# Patient Record
Sex: Female | Born: 1980 | Race: White | Hispanic: No | Marital: Married | Smoking: Never smoker
Health system: Southern US, Community
[De-identification: ages and names within clinical notes are randomized; demographics above are authoritative.]

---

## 2007-12-27 ENCOUNTER — Inpatient Hospital Stay: Payer: Self-pay | Admitting: Obstetrics and Gynecology

## 2008-11-28 ENCOUNTER — Ambulatory Visit: Payer: Self-pay | Admitting: Internal Medicine

## 2013-08-03 ENCOUNTER — Inpatient Hospital Stay: Payer: Self-pay | Admitting: Obstetrics and Gynecology

## 2013-08-03 LAB — CBC WITH DIFFERENTIAL/PLATELET
Basophil #: 0.1 10*3/uL (ref 0.0–0.1)
Basophil %: 0.7 %
EOS PCT: 1 %
Eosinophil #: 0.1 10*3/uL (ref 0.0–0.7)
HCT: 32.6 % — ABNORMAL LOW (ref 35.0–47.0)
HGB: 11.2 g/dL — AB (ref 12.0–16.0)
LYMPHS PCT: 13.5 %
Lymphocyte #: 1.2 10*3/uL (ref 1.0–3.6)
MCH: 30.6 pg (ref 26.0–34.0)
MCHC: 34.3 g/dL (ref 32.0–36.0)
MCV: 89 fL (ref 80–100)
Monocyte #: 0.5 x10 3/mm (ref 0.2–0.9)
Monocyte %: 5.8 %
NEUTROS ABS: 7.2 10*3/uL — AB (ref 1.4–6.5)
Neutrophil %: 79 %
Platelet: 162 10*3/uL (ref 150–440)
RBC: 3.66 10*6/uL — AB (ref 3.80–5.20)
RDW: 13.4 % (ref 11.5–14.5)
WBC: 9.1 10*3/uL (ref 3.6–11.0)

## 2013-08-05 LAB — HEMATOCRIT: HCT: 29.1 % — ABNORMAL LOW (ref 35.0–47.0)

## 2013-12-01 ENCOUNTER — Ambulatory Visit: Payer: Self-pay | Admitting: Medical

## 2013-12-01 LAB — URINALYSIS, COMPLETE
Bilirubin,UR: NEGATIVE
GLUCOSE, UR: NEGATIVE mg/dL (ref 0–75)
Ketone: NEGATIVE
NITRITE: NEGATIVE
PH: 7 (ref 4.5–8.0)
PROTEIN: NEGATIVE
Specific Gravity: 1.02 (ref 1.003–1.030)

## 2013-12-04 LAB — URINE CULTURE

## 2014-09-11 NOTE — H&P (Signed)
L&D Evaluation:  History:  HPI 34 y/o G2P1001 @ 40/2wks for IOL @ term. Care @ [facility] pregancny was twin gestation with fetal demise @ 8wks. Obesity, anemia, GBS+   Presents with above   Patient's Medical History No Chronic Illness   Patient's Surgical History none   Medications Pre Natal Vitamins   Allergies NKDA   Social History none   Family History Non-Contributory   ROS:  ROS All systems were reviewed.  HEENT, CNS, GI, GU, Respiratory, CV, Renal and Musculoskeletal systems were found to be normal.   Exam:  Vital Signs stable   Urine Protein not completed   General no apparent distress   Mental Status clear   Chest clear   Heart normal sinus rhythm   Abdomen gravid, non-tender   Estimated Fetal Weight Average for gestational age   Fetal Position vtx   Fundal Height term   Back no CVAT   Edema 1+   Reflexes 2+   Clonus negative   Pelvic no external lesions, 2-3cm 50% vtx @ -1 small show BOWI   Mebranes Intact   FHT normal rate with no decels, baseline 130's avg variability with accels 140's 150's   FHT Description 136   Ucx regular, Q 3mins 45-60 sec moderate   Skin dry   Lymph no lymphadenopathy   Impression:  Impression IOL @ term   Plan:  Plan monitor contractions and for cervical change, antibiotics for GBBS prophylaxis   Comments Knows what to expect 2nd baby. IV ABX and Pitocin begun. May consider epidural with labor progress. Family present at bedside.   Electronic Signatures: Albertina ParrLugiano, Goble Fudala B (CNM)  (Signed 02-Apr-15 12:11)  Authored: L&D Evaluation   Last Updated: 02-Apr-15 12:11 by Albertina ParrLugiano, Ulric Salzman B (CNM)

## 2015-04-12 ENCOUNTER — Encounter: Payer: Self-pay | Admitting: Emergency Medicine

## 2015-04-12 ENCOUNTER — Ambulatory Visit
Admission: EM | Admit: 2015-04-12 | Discharge: 2015-04-12 | Disposition: A | Discharge status: Home / Self Care | Payer: BLUE CROSS/BLUE SHIELD | Attending: Family Medicine | Admitting: Family Medicine

## 2015-04-12 DIAGNOSIS — H109 Unspecified conjunctivitis: Secondary | ICD-10-CM | POA: Diagnosis not present

## 2015-04-12 MED ORDER — GENTAMICIN SULFATE 0.3 % OP SOLN: 2.0000 [drp] | Freq: Four times a day (QID) | OPHTHALMIC | Status: DC

## 2015-04-12 NOTE — ED Provider Notes (Signed)
CSN: [id number]     Arrival date & time 04/12/15  4098 History   None   Nurses notes were reviewed. Chief Complaint  Patient presents with  . Eye Drainage   patient reports drainage started in the left eye yesterday. She reports being fine yesterday morning but by yesterday afternoon she started noticing irritation in the left eye. She denies any foreign objects and reports this morning he progressively became worse increased redness increase Manna anabiotic was actually closed with staff this morning. To best knowledge she is not running with pinkeye she's had a mild URI but nothing significant lately. She denies any ear pain and sinus pain or sinus pressure. (Consider location/radiation/quality/duration/timing/severity/associated sxs/prior Treatment) Patient is a 34 y.o. female presenting with eye problem. The history is provided by the patient. No language interpreter was used.  Eye Problem Location:  L eye Quality:  Burning, stinging and tearing Severity:  Moderate Onset quality:  Sudden Timing:  Constant Progression:  Worsening Context: not contact lens problem, not direct trauma and not foreign body   Relieved by:  Nothing Ineffective treatments:  None tried Associated symptoms: discharge, itching and redness   Risk factors: not exposed to pinkeye, no previous injury to eye, no recent herpes zoster and no recent URI     History reviewed. No pertinent past medical history. History reviewed. No pertinent past surgical history. History reviewed. No pertinent family history. Social History  Substance Use Topics  . Smoking status: Never Smoker   . Smokeless tobacco: None  . Alcohol Use: Yes   OB History    No data available     Review of Systems  Eyes: Positive for pain, discharge, redness and itching.  All other systems reviewed and are negative.   Allergies  Review of patient's allergies indicates no known allergies.  Home Medications   Prior to Admission medications    Medication Sig Start Date End Date Taking? Authorizing Provider  gentamicin (GARAMYCIN) 0.3 % ophthalmic solution Place 2 drops into the left eye 4 (four) times daily. 04/12/15   Hassan Rowan, MD   Meds Ordered and Administered this Visit  Medications - No data to display  BP 116/46 mmHg  Pulse 68  Temp(Src) 97.2 F (36.2 C) (Tympanic)  Resp 16  Ht  (1.727 m)  Wt 235 lb (106.595 kg)  BMI 35.74 kg/m2  SpO2 100%  LMP 03/29/2015 (Approximate) No data found.   Physical Exam  Constitutional: She is oriented to person, place, and time. She appears well-developed and well-nourished.  HENT:  Head: Normocephalic and atraumatic.  Right Ear: External ear normal.  Left Ear: External ear normal.  Mouth/Throat: Oropharynx is clear and moist.  Eyes: EOM are normal. Pupils are equal, round, and reactive to light. Left eye exhibits discharge. Left conjunctiva is injected. No scleral icterus.    Neck: Normal range of motion. Neck supple.  Musculoskeletal: Normal range of motion.  Lymphadenopathy:    She has no cervical adenopathy.  Neurological: She is alert and oriented to person, place, and time.  Skin: Skin is warm and dry.  Psychiatric: She has a normal mood and affect.    ED Course  Procedures (including critical care time)  Labs Review Labs Reviewed - No data to display  Imaging Review No results found.   Visual Acuity Review  Right Eye Distance: 20/20 uncorrected Left Eye Distance: 20/20 uncorrected Bilateral Distance:    Right Eye Near:   Left Eye Near:    Bilateral Near:  MDM   1. Conjunctivitis of left eye    We'll prescribe Garamycin eyedrops 2 drops left eye for the next 5-7 days got better please return or see ophthalmologist. Work note for today given as well.   Hassan RowanEugene Demarius Archila, MD 04/12/15 [phone]

## 2015-04-12 NOTE — ED Notes (Signed)
Patient c/o redness and drainage from her left eye since yesterday.

## 2015-04-12 NOTE — Discharge Instructions (Signed)
Bacterial Conjunctivitis Bacterial conjunctivitis (commonly called pink eye) is redness, soreness, or puffiness (inflammation) of the white part of your eye. It is caused by a germ called bacteria. These germs can easily spread from person to person (contagious). Your eye often will become red or pink. Your eye may also become irritated, watery, or have a thick discharge.  HOME CARE   Apply a cool, clean washcloth over closed eyelids. Do this for 10-20 minutes, 3-4 times a day while you have pain.  Gently wipe away any fluid coming from the eye with a warm, wet washcloth or cotton ball.  Wash your hands often with soap and water. Use paper towels to dry your hands.  Do not share towels or washcloths.  Change or wash your pillowcase every day.  Do not use eye makeup until the infection is gone.  Do not use machines or drive if your vision is blurry.  Stop using contact lenses. Do not use them again until your doctor says it is okay.  Do not touch the tip of the eye drop bottle or medicine tube with your fingers when you put medicine on the eye. GET HELP RIGHT AWAY IF:   Your eye is not better after 3 days of starting your medicine.  You have a yellowish fluid coming out of the eye.  You have more pain in the eye.  Your eye redness is spreading.  Your vision becomes blurry.  You have a fever or lasting symptoms for more than 2-3 days.  You have a fever and your symptoms suddenly get worse.  You have pain in the face.  Your face gets red or puffy (swollen). MAKE SURE YOU:   Understand these instructions.  Will watch this condition.  Will get help right away if you are not doing well or get worse.   This information is not intended to replace advice given to you by your health care provider. Make sure you discuss any questions you have with your health care provider.   Document Released: 01/28/2008 Document Revised: 04/06/2012 Document Reviewed: 12/25/2011 Elsevier  Interactive Patient Education 2016 Elsevier Inc.  

## 2015-10-30 ENCOUNTER — Other Ambulatory Visit: Payer: Self-pay | Admitting: Obstetrics and Gynecology

## 2017-07-06 ENCOUNTER — Encounter: Payer: Self-pay | Admitting: Emergency Medicine

## 2017-07-06 ENCOUNTER — Other Ambulatory Visit: Payer: Self-pay

## 2017-07-06 ENCOUNTER — Ambulatory Visit
Admission: EM | Admit: 2017-07-06 | Discharge: 2017-07-06 | Disposition: A | Discharge status: Home / Self Care | Payer: BLUE CROSS/BLUE SHIELD | Attending: Family Medicine | Admitting: Family Medicine

## 2017-07-06 DIAGNOSIS — J0100 Acute maxillary sinusitis, unspecified: Secondary | ICD-10-CM

## 2017-07-06 DIAGNOSIS — B9789 Other viral agents as the cause of diseases classified elsewhere: Secondary | ICD-10-CM | POA: Diagnosis not present

## 2017-07-06 DIAGNOSIS — J069 Acute upper respiratory infection, unspecified: Secondary | ICD-10-CM

## 2017-07-06 MED ORDER — BENZONATATE 100 MG PO CAPS: 100.0000 mg | ORAL_CAPSULE | Freq: Three times a day (TID) | ORAL | 0 refills | Status: DC | PRN

## 2017-07-06 MED ORDER — AMOXICILLIN-POT CLAVULANATE 875-125 MG PO TABS: 1.0000 | ORAL_TABLET | Freq: Two times a day (BID) | ORAL | 0 refills | Status: DC

## 2017-07-06 MED ORDER — HYDROCOD POLST-CPM POLST ER 10-8 MG/5ML PO SUER: 5.0000 mL | Freq: Every evening | ORAL | 0 refills | Status: DC | PRN

## 2017-07-06 NOTE — Discharge Instructions (Signed)
Take medication as prescribed. Rest. Drink plenty of fluids.  ° °Follow up with your primary care physician this week as needed. Return to Urgent care for new or worsening concerns.  ° °

## 2017-07-06 NOTE — ED Triage Notes (Signed)
Patient c/o sinus pain and pressure and nasal congestion since Thursday.  Patient denies fevers.

## 2017-07-06 NOTE — ED Provider Notes (Signed)
[facility] ____________________________________________  Time seen: Approximately 2:37 PM  I have reviewed the triage vital signs and the nursing notes.   HISTORY  Chief Complaint Sinus Problem and Nasal Congestion   HPI Gabrielle Jarvis is a 37 y.o. female presenting for evaluation of 5 days of runny nose, nasal congestion, sinus pressure and sinus drainage.  Patient reports cough with associated postnasal drainage, cough is worse at night.  Denies known fevers, chills or body aches.  Reports multiple sick contacts at work.  Has continued to eat and drink well.  Has tried over-the-counter Mucinex D without much change.  States nasal drainage has been thick and green.  Denies other aggravating or alleviating factors.  Reports moderate forehead and cheekbone pressure sensation.  Denies other complaints. Denies chest pain, shortness of breath, abdominal pain, or rash. Denies recent sickness. Denies recent antibiotic use.   Patient's last menstrual period was 06/15/2017 (approximate).Denies pregnancy.   History reviewed. No pertinent past medical history.  There are no active problems to display for this patient.   History reviewed. No pertinent surgical history.   No current facility-administered medications for this encounter.   Current Outpatient Medications:  .  amoxicillin-clavulanate (AUGMENTIN) 875-125 MG tablet, Take 1 tablet by mouth every 12 (twelve) hours., Disp: 20 tablet, Rfl: 0 .  benzonatate (TESSALON PERLES) 100 MG capsule, Take 1 capsule (100 mg total) by mouth 3 (three) times daily as needed for cough., Disp: 15 capsule, Rfl: 0 .  chlorpheniramine-HYDROcodone (TUSSIONEX PENNKINETIC ER) 10-8 MG/5ML SUER, Take 5 mLs by mouth at bedtime as needed for cough. do not drive or operate machinery while taking as can cause drowsiness., Disp: 60 mL, Rfl: 0  Allergies Patient has no known allergies.  Family History  Problem Relation Age of Onset  . Parkinson's  disease Father     Social History Social History   Tobacco Use  . Smoking status: Never Smoker  . Smokeless tobacco: Never Used  Substance Use Topics  . Alcohol use: Yes  . Drug use: Not on file    Review of Systems Constitutional: No fever/chills ENT: No sore throat. As above.  Cardiovascular: Denies chest pain. Respiratory: Denies shortness of breath. Gastrointestinal: No abdominal pain.  Genitourinary: Negative for dysuria. Musculoskeletal: Negative for back pain. Skin: Negative for rash.   ____________________________________________   PHYSICAL EXAM:  VITAL SIGNS: ED Triage Vitals  Enc Vitals Group     BP 07/06/17 1330 (!) 108/53     Pulse Rate 07/06/17 1330 75     Resp 07/06/17 1330 14     Temp 07/06/17 1330 98 F (36.7 C)     Temp Source 07/06/17 1330 Oral     SpO2 07/06/17 1330 100 %     Weight 07/06/17 1327 194 lb (88 kg)     Height 07/06/17 1327 5\' 8"  (1.727 m)     Head Circumference --      Peak Flow --      Pain Score 07/06/17 1327 0     Pain Loc --      Pain Edu? --      Excl. in GC? --     Constitutional: Alert and oriented. Well appearing and in no acute distress. Eyes: Conjunctivae are normal.  Head: Atraumatic.Mild to moderate tenderness to palpation bilateral frontal and maxillary sinuses. No swelling. No erythema.   Ears: no erythema, normal TMs bilaterally.   Nose: nasal congestion with bilateral nasal turbinate erythema and edema.   Mouth/Throat: Mucous membranes are  moist.  Oropharynx non-erythematous.No tonsillar swelling or exudate.  Neck: No stridor.  No cervical spine tenderness to palpation. Hematological/Lymphatic/Immunilogical: No cervical lymphadenopathy. Cardiovascular: Normal rate, regular rhythm. Grossly normal heart sounds.  Good peripheral circulation. Respiratory: Normal respiratory effort.  No retractions. Lungs CTAB. No wheezes, rales or rhonchi. Good air movement.  Musculoskeletal: No cervical, thoracic or lumbar  tenderness to palpation.  Neurologic:  Normal speech and language. No gait instability. Skin:  Skin is warm, dry and intact. No rash noted. Psychiatric: Mood and affect are normal. Speech and behavior are normal.  ___________________________________________   LABS (all labs ordered are listed, but only abnormal results are displayed)  Labs Reviewed - No data to display   PROCEDURES Procedures   INITIAL IMPRESSION / ASSESSMENT AND PLAN / ED COURSE  Pertinent labs & imaging results that were available during my care of the patient were reviewed by me and considered in my medical decision making (see chart for details).  Well-appearing patient.  No acute distress.  Suspect viral upper respiratory infection.  Suspect patient with current viral sinusitis, discussed with patient supportive care, Tessalon Perles, as needed Tussionex and over-the-counter Sudafed.  Hardcopy Rx of Augmentin given to begin after 3 days if symptoms not improving.  Discussed indication, risks and benefits of medications with patient.  Discussed follow up with Primary care physician this week. Discussed follow up and return parameters including no resolution or any worsening concerns. Patient verbalized understanding and agreed to plan.   ____________________________________________   FINAL CLINICAL IMPRESSION(S) / ED DIAGNOSES  Final diagnoses:  Viral URI with cough  Acute maxillary sinusitis, recurrence not specified     ED Discharge Orders        Ordered    amoxicillin-clavulanate (AUGMENTIN) 875-125 MG tablet  Every 12 hours     07/06/17 1438    benzonatate (TESSALON PERLES) 100 MG capsule  3 times daily PRN     07/06/17 1438    chlorpheniramine-HYDROcodone (TUSSIONEX PENNKINETIC ER) 10-8 MG/5ML SUER  At bedtime PRN     07/06/17 1438       Note: This dictation was prepared with Dragon dictation along with smaller phrase technology. Any transcriptional errors that result from this process are  unintentional.         Renford DillsMiller, Jaynia Fendley, NP 07/06/17 1544

## 2017-07-14 ENCOUNTER — Ambulatory Visit
Admission: EM | Admit: 2017-07-14 | Discharge: 2017-07-14 | Disposition: A | Discharge status: Home / Self Care | Payer: BLUE CROSS/BLUE SHIELD | Attending: Family Medicine | Admitting: Family Medicine

## 2017-07-14 ENCOUNTER — Encounter: Payer: Self-pay | Admitting: Emergency Medicine

## 2017-07-14 ENCOUNTER — Other Ambulatory Visit: Payer: Self-pay

## 2017-07-14 DIAGNOSIS — R05 Cough: Secondary | ICD-10-CM

## 2017-07-14 DIAGNOSIS — R0981 Nasal congestion: Secondary | ICD-10-CM | POA: Diagnosis not present

## 2017-07-14 DIAGNOSIS — M791 Myalgia, unspecified site: Secondary | ICD-10-CM | POA: Diagnosis not present

## 2017-07-14 DIAGNOSIS — J111 Influenza due to unidentified influenza virus with other respiratory manifestations: Secondary | ICD-10-CM

## 2017-07-14 DIAGNOSIS — R69 Illness, unspecified: Secondary | ICD-10-CM

## 2017-07-14 LAB — RAPID STREP SCREEN (MED CTR MEBANE ONLY): Streptococcus, Group A Screen (Direct): NEGATIVE

## 2017-07-14 MED ORDER — OSELTAMIVIR PHOSPHATE 75 MG PO CAPS: 75.0000 mg | ORAL_CAPSULE | Freq: Two times a day (BID) | ORAL | 0 refills | Status: DC

## 2017-07-14 NOTE — ED Triage Notes (Signed)
Patient states she developed sneezing, body aches, fever (tmax 100.4) and scratchy throat this afternoon.  States she has 1 child with the flu and 1 child with strep throat.

## 2017-07-14 NOTE — Discharge Instructions (Signed)
Take medication as prescribed. Rest. Drink plenty of fluids.  ° °Follow up with your primary care physician this week as needed. Return to Urgent care for new or worsening concerns.  ° °

## 2017-07-14 NOTE — ED Provider Notes (Signed)
[facility] ____________________________________________  Time seen: Approximately 7:08 PM  I have reviewed the triage vital signs and the nursing notes.   HISTORY  Chief Complaint Influenza   HPI Gabrielle Jarvis is a 37 y.o. female presenting for evaluation of runny nose, nasal congestion, cough, chills and body aches that started sudden onset at lunchtime today.  Patient reports this morning she felt fine.  Fever max 100.4 this afternoon.  Reports was recently seen in the urgent care for sinusitis complaints in which she did not have to take the antibiotic.  States sinusitis complaints resolved with over-the-counter Sudafed and prescription cough medications and supportive care.  States that she was doing better.  Reports 2 of her daughters have been sick this week, one tested positive for flu and one tested positive for strep this past Friday.  States that she did take Tylenol this afternoon.  Denies other aggravating or alleviating factors.  Slight scratchy throat sensation.  Continues to drink fluids well. Denies chest pain, shortness of breath, abdominal pain, or rash. Denies recent sickness. Denies recent antibiotic use.    History reviewed. No pertinent past medical history.  There are no active problems to display for this patient.   History reviewed. No pertinent surgical history.   No current facility-administered medications for this encounter.   Current Outpatient Medications:  .  chlorpheniramine-HYDROcodone (TUSSIONEX PENNKINETIC ER) 10-8 MG/5ML SUER, Take 5 mLs by mouth at bedtime as needed for cough. do not drive or operate machinery while taking as can cause drowsiness., Disp: 60 mL, Rfl: 0 .  oseltamivir (TAMIFLU) 75 MG capsule, Take 1 capsule (75 mg total) by mouth every 12 (twelve) hours., Disp: 10 capsule, Rfl: 0  Allergies Patient has no known allergies.  Family History  Problem Relation Age of Onset  . Parkinson's disease Father     Social  History Social History   Tobacco Use  . Smoking status: Never Smoker  . Smokeless tobacco: Never Used  Substance Use Topics  . Alcohol use: Yes  . Drug use: Not on file    Review of Systems Constitutional: As above Eyes: No visual changes. ENT: As above Cardiovascular: Denies chest pain. Respiratory: Denies shortness of breath. Gastrointestinal: No abdominal pain.  No nausea, no vomiting.  No diarrhea.  No constipation. Genitourinary: Negative for dysuria. Musculoskeletal: Negative for back pain. Skin: Negative for rash. Neurological: Negative for headaches, focal weakness or numbness.  10-point ROS otherwise negative.  ____________________________________________   PHYSICAL EXAM:  VITAL SIGNS: ED Triage Vitals  Enc Vitals Group     BP 07/14/17 1842 (!) 110/50     Pulse Rate 07/14/17 1842 79     Resp 07/14/17 1842 16     Temp 07/14/17 1842 99.1 F (37.3 C)     Temp src --      SpO2 07/14/17 1842 100 %     Weight 07/14/17 1839 191 lb (86.6 kg)     Height 07/14/17 1839 5\' 8"  (1.727 m)     Head Circumference --      Peak Flow --      Pain Score 07/14/17 1839 2     Pain Loc --      Pain Edu? --      Excl. in GC? --     Constitutional: Alert and oriented. Well appearing and in no acute distress. Eyes: Conjunctivae are normal.  Head: Atraumatic. No sinus tenderness to palpation. No swelling. No erythema.  Ears: no erythema, normal TMs bilaterally.  Nose:Nasal congestion with clear rhinorrhea  Mouth/Throat: Mucous membranes are moist. Mild pharyngeal erythema. No tonsillar swelling or exudate.  Neck: No stridor.  No cervical spine tenderness to palpation. Hematological/Lymphatic/Immunilogical: No cervical lymphadenopathy. Cardiovascular: Normal rate, regular rhythm. Grossly normal heart sounds.  Good peripheral circulation. Respiratory: Normal respiratory effort.  No retractions. No wheezes, rales or rhonchi. Good air movement.  Musculoskeletal: Ambulatory with  steady gait. Neurologic:  Normal speech and language. No gait instability. Skin:  Skin appears warm, dry and intact. No rash noted. Psychiatric: Mood and affect are normal. Speech and behavior are normal. ___________________________________________   LABS (all labs ordered are listed, but only abnormal results are displayed)  Labs Reviewed  RAPID STREP SCREEN (NOT AT [facility])  CULTURE, GROUP A STREP [facility])   ____________________________________________   PROCEDURES Procedures   INITIAL IMPRESSION / ASSESSMENT AND PLAN / ED COURSE  Pertinent labs & imaging results that were available during my care of the patient were reviewed by me and considered in my medical decision making (see chart for details).  Well-appearing patient.  No acute distress.  Strep negative, will culture.  Suspect influenza.  Discussed use of Tamiflu, Tamiflu Rx given.  Encourage rest, fluids, supportive care.Discussed indication, risks and benefits of medications with patient.  Discussed follow up with Primary care physician this week. Discussed follow up and return parameters including no resolution or any worsening concerns. Patient verbalized understanding and agreed to plan.   ____________________________________________   FINAL CLINICAL IMPRESSION(S) / ED DIAGNOSES  Final diagnoses:  Influenza-like illness     ED Discharge Orders        Ordered    oseltamivir (TAMIFLU) 75 MG capsule  Every 12 hours     07/14/17 1921       Note: This dictation was prepared with Dragon dictation along with smaller phrase technology. Any transcriptional errors that result from this process are unintentional.         Renford Dills, NP 07/14/17 2110

## 2017-07-17 LAB — CULTURE, GROUP A STREP (THRC)

## 2019-11-21 ENCOUNTER — Other Ambulatory Visit: Payer: Self-pay | Admitting: Nurse Practitioner

## 2019-11-21 DIAGNOSIS — Z1231 Encounter for screening mammogram for malignant neoplasm of breast: Secondary | ICD-10-CM

## 2019-12-14 ENCOUNTER — Ambulatory Visit
Admission: RE | Admit: 2019-12-14 | Discharge: 2019-12-14 | Disposition: A | Discharge status: Home / Self Care | Payer: BC Managed Care – PPO | Source: Ambulatory Visit | Attending: Nurse Practitioner | Admitting: Nurse Practitioner

## 2019-12-14 ENCOUNTER — Other Ambulatory Visit: Payer: Self-pay

## 2019-12-14 DIAGNOSIS — Z1231 Encounter for screening mammogram for malignant neoplasm of breast: Secondary | ICD-10-CM | POA: Insufficient documentation

## 2019-12-21 ENCOUNTER — Other Ambulatory Visit: Payer: Self-pay | Admitting: Nurse Practitioner

## 2019-12-21 DIAGNOSIS — R928 Other abnormal and inconclusive findings on diagnostic imaging of breast: Secondary | ICD-10-CM

## 2019-12-21 DIAGNOSIS — N6320 Unspecified lump in the left breast, unspecified quadrant: Secondary | ICD-10-CM

## 2019-12-22 ENCOUNTER — Other Ambulatory Visit: Payer: Self-pay

## 2019-12-22 ENCOUNTER — Ambulatory Visit
Admission: RE | Admit: 2019-12-22 | Discharge: 2019-12-22 | Disposition: A | Discharge status: Home / Self Care | Payer: BC Managed Care – PPO | Source: Ambulatory Visit | Attending: Nurse Practitioner | Admitting: Nurse Practitioner

## 2019-12-22 DIAGNOSIS — N6320 Unspecified lump in the left breast, unspecified quadrant: Secondary | ICD-10-CM

## 2019-12-22 DIAGNOSIS — R928 Other abnormal and inconclusive findings on diagnostic imaging of breast: Secondary | ICD-10-CM | POA: Insufficient documentation

## 2020-11-20 ENCOUNTER — Other Ambulatory Visit: Payer: Self-pay | Admitting: Nurse Practitioner

## 2020-11-20 DIAGNOSIS — Z1231 Encounter for screening mammogram for malignant neoplasm of breast: Secondary | ICD-10-CM | POA: Diagnosis not present

## 2020-11-20 DIAGNOSIS — Z0000 Encounter for general adult medical examination without abnormal findings: Secondary | ICD-10-CM | POA: Diagnosis not present

## 2020-11-20 DIAGNOSIS — E669 Obesity, unspecified: Secondary | ICD-10-CM | POA: Diagnosis not present

## 2020-12-17 ENCOUNTER — Ambulatory Visit
Admission: RE | Admit: 2020-12-17 | Discharge: 2020-12-17 | Disposition: A | Discharge status: Home / Self Care | Payer: BC Managed Care – PPO | Source: Ambulatory Visit | Attending: Nurse Practitioner | Admitting: Nurse Practitioner

## 2020-12-17 ENCOUNTER — Other Ambulatory Visit: Payer: Self-pay

## 2020-12-17 DIAGNOSIS — Z1231 Encounter for screening mammogram for malignant neoplasm of breast: Secondary | ICD-10-CM | POA: Diagnosis not present

## 2021-07-24 DIAGNOSIS — N6310 Unspecified lump in the right breast, unspecified quadrant: Secondary | ICD-10-CM | POA: Diagnosis not present

## 2021-07-25 ENCOUNTER — Other Ambulatory Visit: Payer: Self-pay | Admitting: Nurse Practitioner

## 2021-07-25 DIAGNOSIS — N6310 Unspecified lump in the right breast, unspecified quadrant: Secondary | ICD-10-CM

## 2021-08-08 ENCOUNTER — Ambulatory Visit
Admission: RE | Admit: 2021-08-08 | Discharge: 2021-08-08 | Disposition: A | Discharge status: Home / Self Care | Payer: BC Managed Care – PPO | Source: Ambulatory Visit | Attending: Nurse Practitioner | Admitting: Nurse Practitioner

## 2021-08-08 DIAGNOSIS — N6310 Unspecified lump in the right breast, unspecified quadrant: Secondary | ICD-10-CM

## 2021-08-08 DIAGNOSIS — R922 Inconclusive mammogram: Secondary | ICD-10-CM | POA: Diagnosis not present

## 2021-10-02 IMAGING — MG MM DIGITAL SCREENING BILAT W/ TOMO AND CAD
8 series · 8 of 24 positions shown · non-contrast
Comparison: Previous exam(s).

CLINICAL DATA: Screening.

EXAM:
DIGITAL SCREENING BILATERAL MAMMOGRAM WITH TOMOSYNTHESIS AND CAD
TECHNIQUE: Bilateral screening digital craniocaudal and mediolateral oblique
mammograms were obtained. Bilateral screening digital breast
tomosynthesis was performed. The images were evaluated with
computer-aided detection.

[L CC synth-2D]
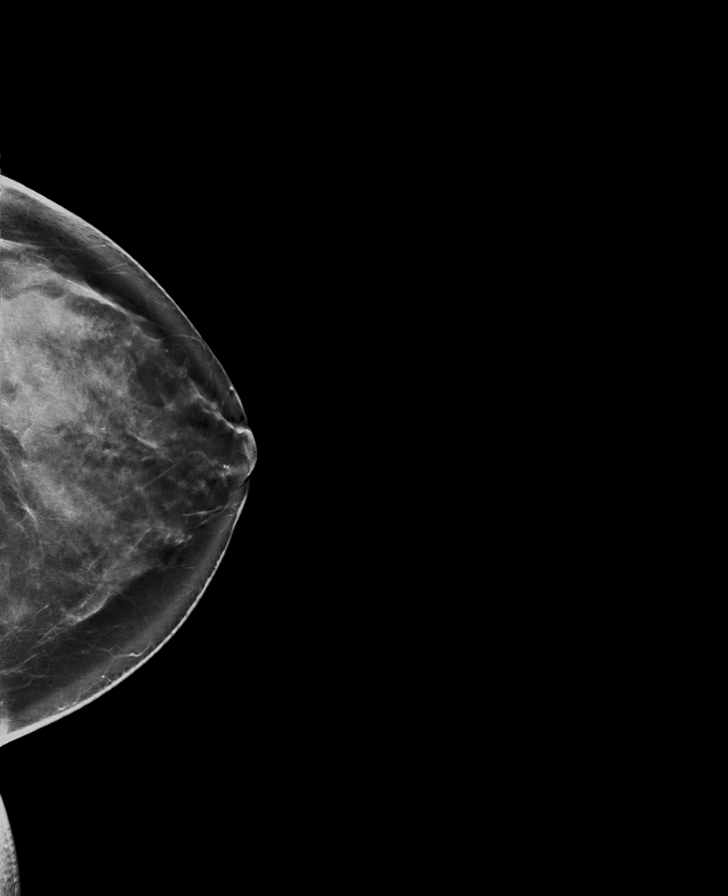

[R MLO synth-2D]
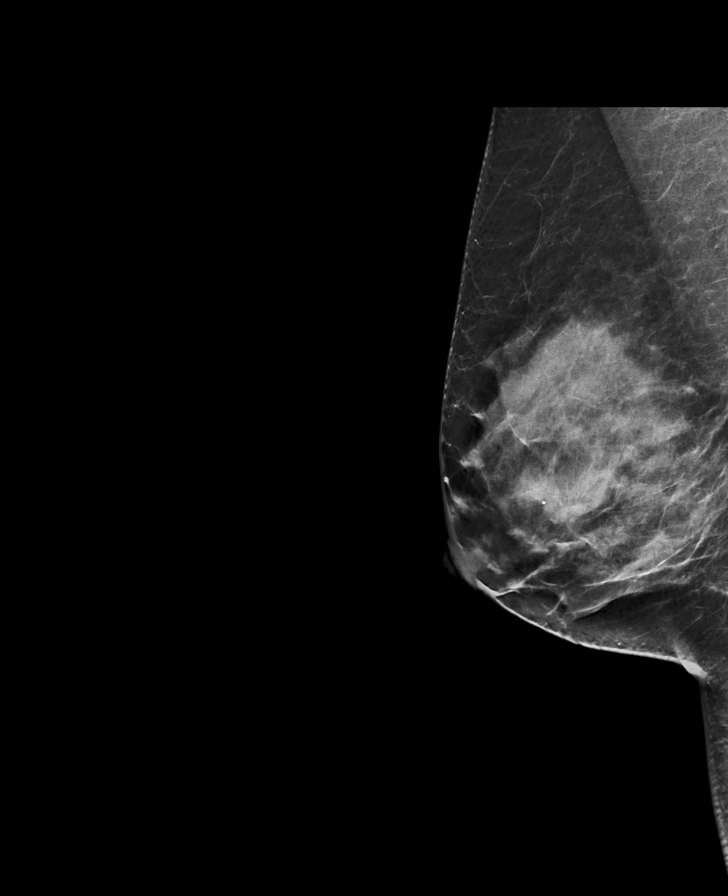

[L MLO synth-2D]
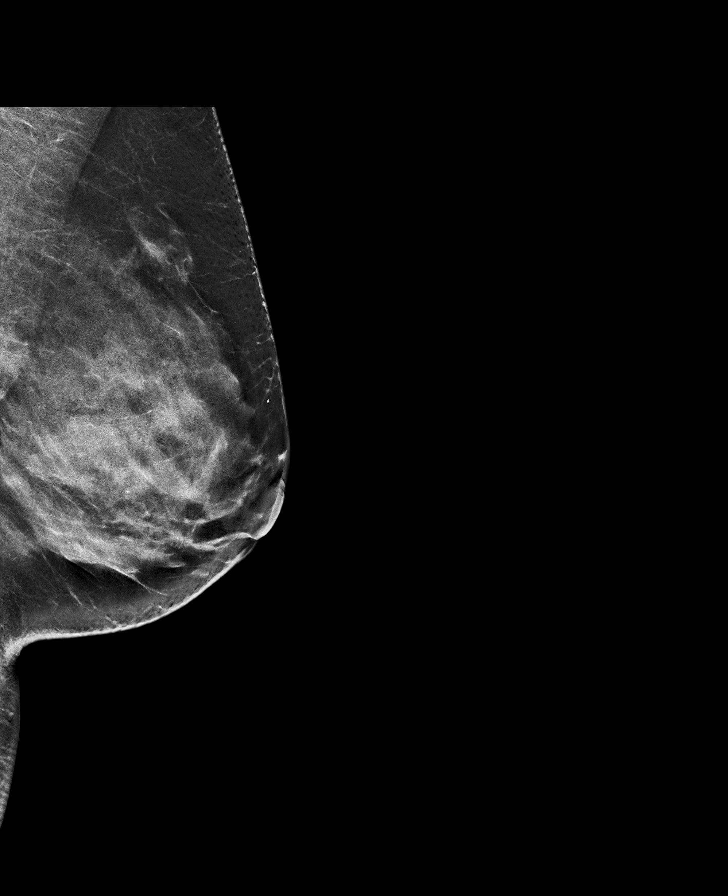

[R CC synth-2D]
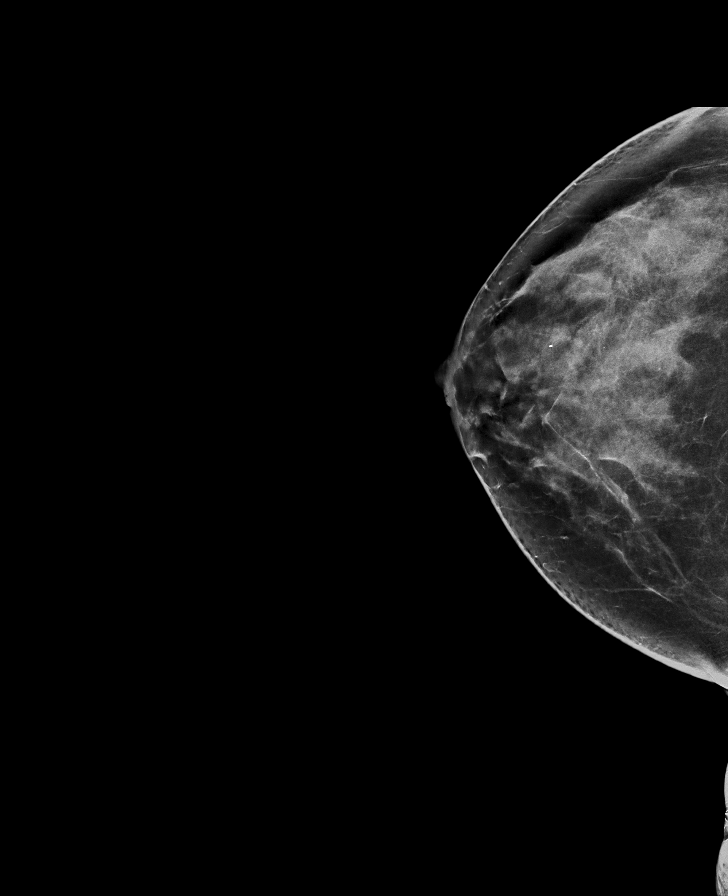

[L MLO tomo · tomo slice 41/81.0]
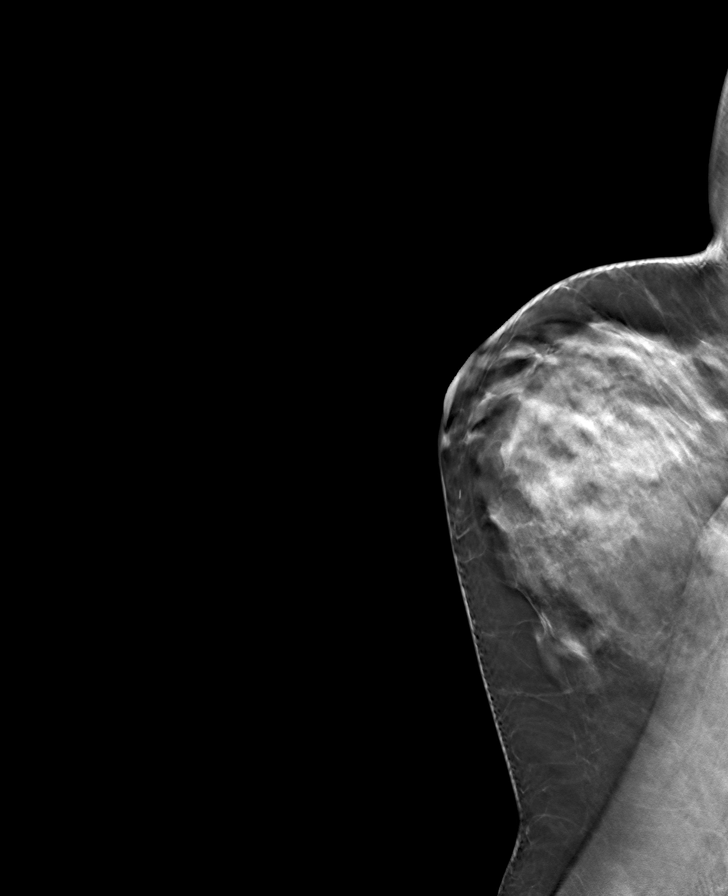

[L CC tomo · tomo slice 44/87.0]
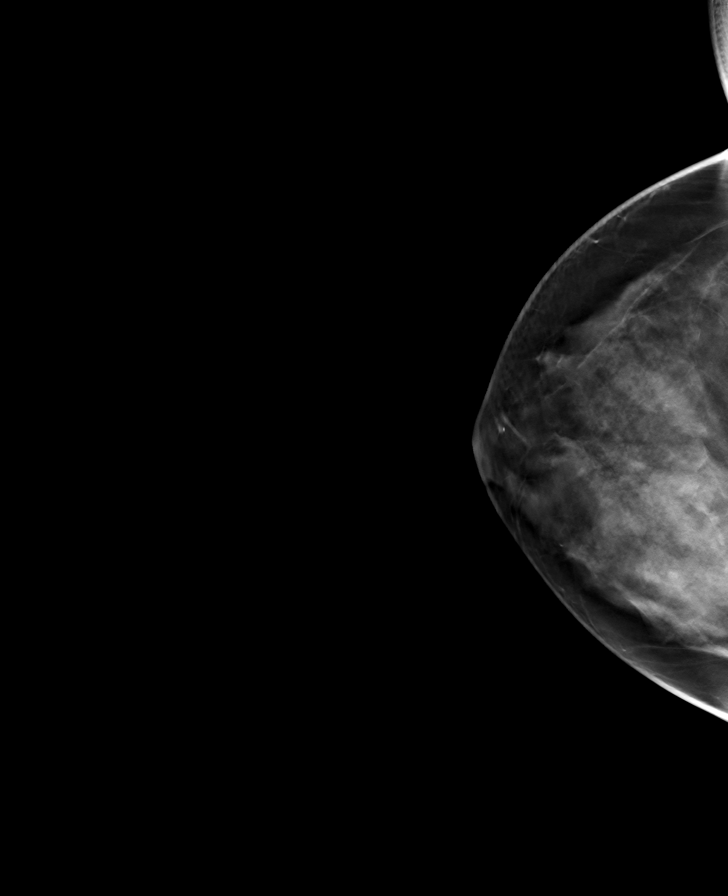

[R CC tomo · tomo slice 39/78.0]
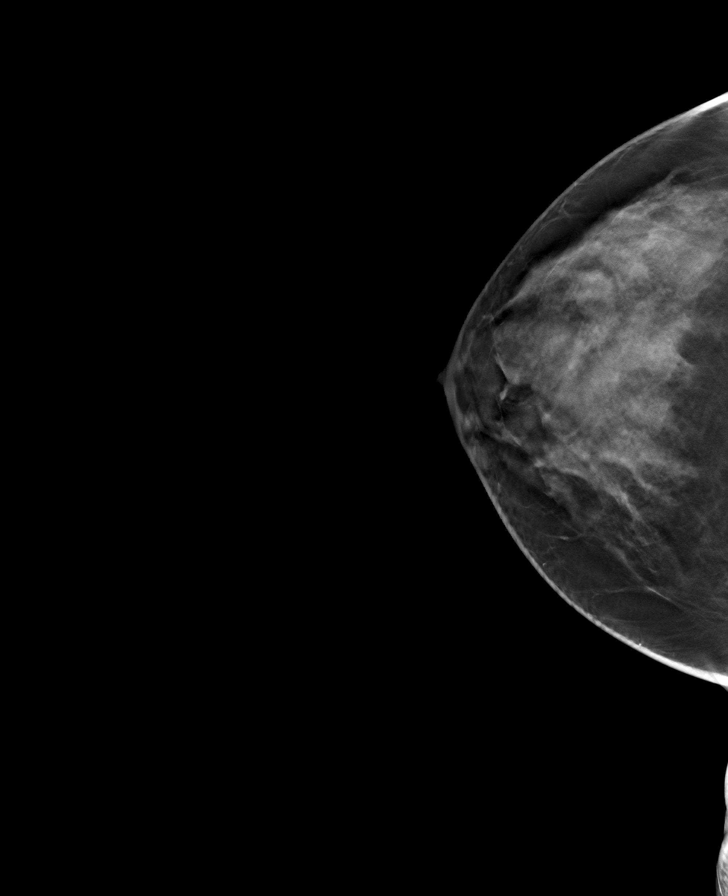

[R MLO tomo · tomo slice 39/78.0]
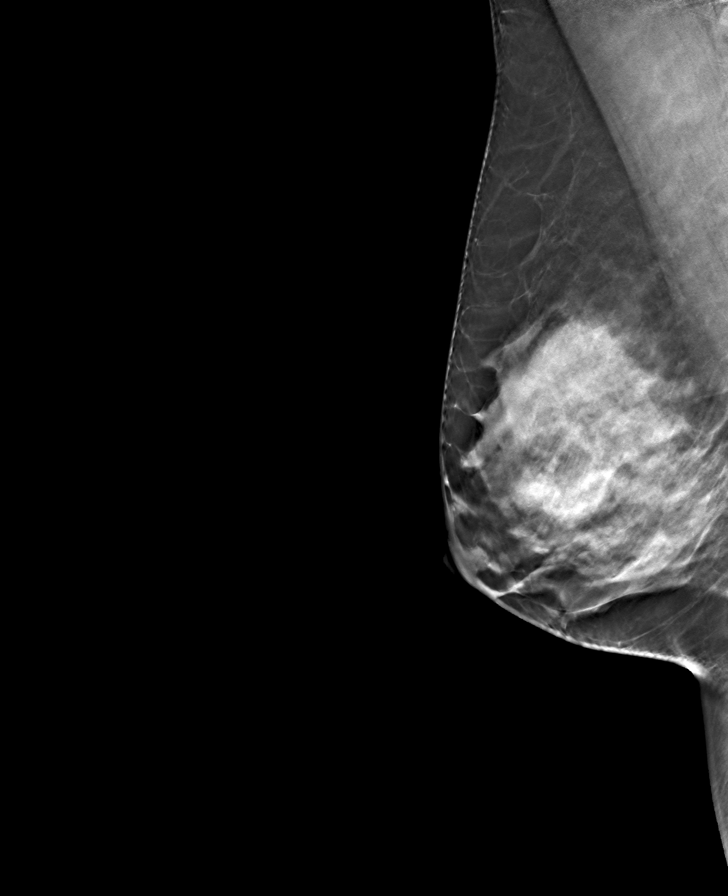

[8 of 24 positions shown; findings below may reference images not displayed]

ACR Breast Density Category c: The breast tissue is heterogeneously
dense, which may obscure small masses.
FINDINGS: There are no findings suspicious for malignancy.
IMPRESSION: No mammographic evidence of malignancy. A result letter of this
screening mammogram will be mailed directly to the patient.

RECOMMENDATION:
Screening mammogram in one year. (Code:Q3-W-BC3)

BI-RADS CATEGORY  1: Negative.

## 2021-11-21 DIAGNOSIS — Z1231 Encounter for screening mammogram for malignant neoplasm of breast: Secondary | ICD-10-CM | POA: Diagnosis not present

## 2021-11-21 DIAGNOSIS — Z0000 Encounter for general adult medical examination without abnormal findings: Secondary | ICD-10-CM | POA: Diagnosis not present

## 2021-11-24 ENCOUNTER — Other Ambulatory Visit: Payer: Self-pay | Admitting: Nurse Practitioner

## 2021-11-24 DIAGNOSIS — Z1231 Encounter for screening mammogram for malignant neoplasm of breast: Secondary | ICD-10-CM

## 2021-12-18 ENCOUNTER — Ambulatory Visit
Admission: RE | Admit: 2021-12-18 | Discharge: 2021-12-18 | Disposition: A | Discharge status: Home / Self Care | Payer: BC Managed Care – PPO | Source: Ambulatory Visit | Attending: Nurse Practitioner | Admitting: Nurse Practitioner

## 2021-12-18 DIAGNOSIS — Z1231 Encounter for screening mammogram for malignant neoplasm of breast: Secondary | ICD-10-CM | POA: Insufficient documentation

## 2021-12-19 ENCOUNTER — Other Ambulatory Visit: Payer: Self-pay | Admitting: Nurse Practitioner

## 2021-12-26 ENCOUNTER — Other Ambulatory Visit: Payer: Self-pay | Admitting: Nurse Practitioner

## 2021-12-26 DIAGNOSIS — N6489 Other specified disorders of breast: Secondary | ICD-10-CM

## 2021-12-26 DIAGNOSIS — R928 Other abnormal and inconclusive findings on diagnostic imaging of breast: Secondary | ICD-10-CM

## 2021-12-26 DIAGNOSIS — N63 Unspecified lump in unspecified breast: Secondary | ICD-10-CM

## 2022-01-12 ENCOUNTER — Ambulatory Visit
Admission: RE | Admit: 2022-01-12 | Discharge: 2022-01-12 | Disposition: A | Discharge status: Home / Self Care | Payer: BC Managed Care – PPO | Source: Ambulatory Visit | Attending: Nurse Practitioner | Admitting: Nurse Practitioner

## 2022-01-12 DIAGNOSIS — R928 Other abnormal and inconclusive findings on diagnostic imaging of breast: Secondary | ICD-10-CM

## 2022-01-12 DIAGNOSIS — N6489 Other specified disorders of breast: Secondary | ICD-10-CM | POA: Insufficient documentation

## 2022-01-12 DIAGNOSIS — N63 Unspecified lump in unspecified breast: Secondary | ICD-10-CM | POA: Insufficient documentation

## 2022-05-07 ENCOUNTER — Telehealth: Payer: BC Managed Care – PPO | Admitting: Emergency Medicine

## 2022-05-07 DIAGNOSIS — J329 Chronic sinusitis, unspecified: Secondary | ICD-10-CM | POA: Diagnosis not present

## 2022-05-07 MED ORDER — AMOXICILLIN-POT CLAVULANATE 875-125 MG PO TABS: 1.0000 | ORAL_TABLET | Freq: Two times a day (BID) | ORAL | 0 refills | Status: DC

## 2022-05-07 NOTE — Patient Instructions (Signed)
  Anaclara E Allor, thank you for joining Montine Circle, PA-C for today's virtual visit.  While this provider is not your primary care provider (PCP), if your PCP is located in our provider database this encounter information will be shared with them immediately following your visit.   [facility] account gives you access to today's visit and all your visits, tests, and labs performed at [facility] " click here if you don't have a [facility] account or go to [email or web address]  Consent: (Patient) Gabrielle Jarvis provided verbal consent for this virtual visit at the beginning of the encounter.  Current Medications:  Current Outpatient Medications:    amoxicillin-clavulanate (AUGMENTIN) 875-125 MG tablet, Take 1 tablet by mouth every 12 (twelve) hours., Disp: 14 tablet, Rfl: 0   chlorpheniramine-HYDROcodone (TUSSIONEX PENNKINETIC ER) 10-8 MG/5ML SUER, Take 5 mLs by mouth at bedtime as needed for cough. do not drive or operate machinery while taking as can cause drowsiness., Disp: 60 mL, Rfl: 0   oseltamivir (TAMIFLU) 75 MG capsule, Take 1 capsule (75 mg total) by mouth every 12 (twelve) hours., Disp: 10 capsule, Rfl: 0   Medications ordered in this encounter:  Meds ordered this encounter  Medications   amoxicillin-clavulanate (AUGMENTIN) 875-125 MG tablet    Sig: Take 1 tablet by mouth every 12 (twelve) hours.    Dispense:  14 tablet    Refill:  0    Order Specific Question:   Supervising Provider    Answer:   Chase Picket [id number]     *If you need refills on other medications prior to your next appointment, please contact your pharmacy*  Follow-Up: Call back or seek an in-person evaluation if the symptoms worsen or if the condition fails to improve as anticipated.  [facility] [phone]  Other Instructions Continue with Zyrtec, Sudafed, and nasal saline.  Take antibiotics as directed.   If you have been instructed to have  an in-person evaluation today at a local Urgent Care facility, please use the link below. It will take you to a list of all of our available [facility], including address, phone number and hours of operation. Please do not delay care.  [facility]  If you or a family member do not have a primary care provider, use the link below to schedule a visit and establish care. When you choose a [facility] primary care physician or advanced practice provider, you gain a long-term partner in health. Find a Primary Care Provider  Learn more about [facility] in-office and virtual care options: [facility]

## 2022-05-07 NOTE — Progress Notes (Signed)
Virtual Visit Consent   Gabrielle Jarvis, you are scheduled for a virtual visit with a [facility] provider today. Just as with appointments in the office, your consent must be obtained to participate. Your consent will be active for this visit and any virtual visit you may have with one of our providers in the next 365 days. If you have a MyChart account, a copy of this consent can be sent to you electronically.  As this is a virtual visit, video technology does not allow for your provider to perform a traditional examination. This may limit your provider's ability to fully assess your condition. If your provider identifies any concerns that need to be evaluated in person or the need to arrange testing (such as labs, EKG, etc.), we will make arrangements to do so. Although advances in technology are sophisticated, we cannot ensure that it will always work on either your end or our end. If the connection with a video visit is poor, the visit may have to be switched to a telephone visit. With either a video or telephone visit, we are not always able to ensure that we have a secure connection.  By engaging in this virtual visit, you consent to the provision of healthcare and authorize for your insurance to be billed (if applicable) for the services provided during this visit. Depending on your insurance coverage, you may receive a charge related to this service.  I need to obtain your verbal consent now. Are you willing to proceed with your visit today? Braylei E Johal has provided verbal consent on 05/07/2022 for a virtual visit (video or telephone). Montine Circle, PA-C  Date: 05/07/2022 10:04 AM  Virtual Visit via Video Note   I, Montine Circle, connected with  JAYE SAAL  ([id number], 1980-11-23) on 05/07/22 at 10:00 AM EST by a video-enabled telemedicine application and verified that I am speaking with the correct person using two identifiers.  Location: Patient: Virtual Visit Location Patient:  Home Provider: Virtual Visit Location Provider: Home Office   I discussed the limitations of evaluation and management by telemedicine and the availability of in person appointments. The patient expressed understanding and agreed to proceed.    History of Present Illness: Gabrielle Jarvis is a 42 y.o. who identifies as a female who was assigned female at birth, and is being seen today for sinus congestion.  Has been taking zyrtec and sudafed.  States that this is her 3rd week of symptoms.  She states that she was improving, but is now worsening and having more significant sinus pressure that radiates into her teeth.  She denies allergies.  Denies pregnancy or breastfeeding.  HPI: HPI  Problems: There are no problems to display for this patient.   Allergies: No Known Allergies Medications:  Current Outpatient Medications:    amoxicillin-clavulanate (AUGMENTIN) 875-125 MG tablet, Take 1 tablet by mouth every 12 (twelve) hours., Disp: 14 tablet, Rfl: 0   chlorpheniramine-HYDROcodone (TUSSIONEX PENNKINETIC ER) 10-8 MG/5ML SUER, Take 5 mLs by mouth at bedtime as needed for cough. do not drive or operate machinery while taking as can cause drowsiness., Disp: 60 mL, Rfl: 0   oseltamivir (TAMIFLU) 75 MG capsule, Take 1 capsule (75 mg total) by mouth every 12 (twelve) hours., Disp: 10 capsule, Rfl: 0  Observations/Objective: Patient is well-developed, well-nourished in no acute distress.  Resting comfortably at work.  Head is normocephalic, atraumatic.  No labored breathing.  Speech is clear and coherent with logical content.  Patient is alert  and oriented at baseline.    Assessment and Plan: 1. Sinusitis, unspecified chronicity, unspecified location   Meds ordered this encounter  Medications   amoxicillin-clavulanate (AUGMENTIN) 875-125 MG tablet    Sig: Take 1 tablet by mouth every 12 (twelve) hours.    Dispense:  14 tablet    Refill:  0    Order Specific Question:   Supervising  Provider    Answer:   Chase Picket [id number]   Continue zyrtec, sudafed, and nasal saline.  Will add Augmentin based on length and worsening symptoms.  Follow Up Instructions: I discussed the assessment and treatment plan with the patient. The patient was provided an opportunity to ask questions and all were answered. The patient agreed with the plan and demonstrated an understanding of the instructions.  A copy of instructions were sent to the patient via MyChart unless otherwise noted below.     The patient was advised to call back or seek an in-person evaluation if the symptoms worsen or if the condition fails to improve as anticipated.  Time:  I spent 11 minutes with the patient via telehealth technology discussing the above problems/concerns.    Montine Circle, PA-C

## 2022-11-24 ENCOUNTER — Other Ambulatory Visit: Payer: Self-pay | Admitting: Nurse Practitioner

## 2022-11-24 DIAGNOSIS — Z1331 Encounter for screening for depression: Secondary | ICD-10-CM | POA: Diagnosis not present

## 2022-11-24 DIAGNOSIS — Z1231 Encounter for screening mammogram for malignant neoplasm of breast: Secondary | ICD-10-CM

## 2022-11-24 DIAGNOSIS — Z0000 Encounter for general adult medical examination without abnormal findings: Secondary | ICD-10-CM | POA: Diagnosis not present

## 2022-11-24 DIAGNOSIS — Z1329 Encounter for screening for other suspected endocrine disorder: Secondary | ICD-10-CM | POA: Diagnosis not present

## 2022-11-24 DIAGNOSIS — Z13220 Encounter for screening for lipoid disorders: Secondary | ICD-10-CM | POA: Diagnosis not present

## 2023-01-27 ENCOUNTER — Ambulatory Visit
Admission: RE | Admit: 2023-01-27 | Discharge: 2023-01-27 | Disposition: A | Discharge status: Home / Self Care | Payer: BC Managed Care – PPO | Source: Ambulatory Visit | Attending: Nurse Practitioner | Admitting: Nurse Practitioner

## 2023-01-27 DIAGNOSIS — Z1231 Encounter for screening mammogram for malignant neoplasm of breast: Secondary | ICD-10-CM | POA: Diagnosis present

## 2023-12-21 ENCOUNTER — Other Ambulatory Visit: Payer: Self-pay | Admitting: Nurse Practitioner

## 2023-12-21 DIAGNOSIS — Z1231 Encounter for screening mammogram for malignant neoplasm of breast: Secondary | ICD-10-CM

## 2024-01-31 ENCOUNTER — Ambulatory Visit
Admission: RE | Admit: 2024-01-31 | Discharge: 2024-01-31 | Disposition: A | Discharge status: Home / Self Care | Source: Ambulatory Visit | Attending: Nurse Practitioner | Admitting: Nurse Practitioner

## 2024-01-31 DIAGNOSIS — Z1231 Encounter for screening mammogram for malignant neoplasm of breast: Secondary | ICD-10-CM | POA: Insufficient documentation

## 2024-03-09 ENCOUNTER — Encounter: Payer: Self-pay | Admitting: Gastroenterology

## 2024-03-13 NOTE — Anesthesia Preprocedure Evaluation (Signed)
 Anesthesia Evaluation  Patient identified by MRN, date of birth, ID band Patient awake    Reviewed: Allergy & Precautions, H&P , NPO status , Patient's Chart, lab work & pertinent test results  Airway Mallampati: I  TM Distance: <3 FB Neck ROM: Full    Dental no notable dental hx.    Pulmonary neg pulmonary ROS   Pulmonary exam normal breath sounds clear to auscultation       Cardiovascular negative cardio ROS Normal cardiovascular exam Rhythm:Regular Rate:Normal     Neuro/Psych negative neurological ROS  negative psych ROS   GI/Hepatic negative GI ROS, Neg liver ROS,,,  Endo/Other  negative endocrine ROS    Renal/GU negative Renal ROS  negative genitourinary   Musculoskeletal negative musculoskeletal ROS (+)    Abdominal   Peds negative pediatric ROS (+)  Hematology negative hematology ROS (+)   Anesthesia Other Findings   Reproductive/Obstetrics negative OB ROS                              Anesthesia Physical Anesthesia Plan  ASA: 1  Anesthesia Plan: General   Post-op Pain Management:    Induction: Intravenous  PONV Risk Score and Plan:   Airway Management Planned: Natural Airway and Nasal Cannula  Additional Equipment:   Intra-op Plan:   Post-operative Plan:   Informed Consent: I have reviewed the patients History and Physical, chart, labs and discussed the procedure including the risks, benefits and alternatives for the proposed anesthesia with the patient or authorized representative who has indicated his/her understanding and acceptance.     Dental Advisory Given  Plan Discussed with: Anesthesiologist, CRNA and Surgeon  Anesthesia Plan Comments: (Patient consented for risks of anesthesia including but not limited to:  - adverse reactions to medications - risk of airway placement if required - damage to eyes, teeth, lips or other oral mucosa - nerve damage  due to positioning  - sore throat or hoarseness - Damage to heart, brain, nerves, lungs, other parts of body or loss of life  Patient voiced understanding and assent.)         Anesthesia Quick Evaluation

## 2024-03-14 ENCOUNTER — Encounter: Payer: Self-pay | Admitting: Gastroenterology

## 2024-03-14 ENCOUNTER — Encounter
Admission: RE | Disposition: A | Discharge status: Home / Self Care | Payer: Self-pay | Source: Home / Self Care | Attending: Gastroenterology

## 2024-03-14 ENCOUNTER — Other Ambulatory Visit: Payer: Self-pay

## 2024-03-14 ENCOUNTER — Ambulatory Visit
Admission: RE | Admit: 2024-03-14 | Discharge: 2024-03-14 | Disposition: A | Discharge status: Home / Self Care | Attending: Gastroenterology | Admitting: Gastroenterology

## 2024-03-14 ENCOUNTER — Ambulatory Visit: Payer: Self-pay | Admitting: Anesthesiology

## 2024-03-14 DIAGNOSIS — D125 Benign neoplasm of sigmoid colon: Secondary | ICD-10-CM | POA: Diagnosis not present

## 2024-03-14 DIAGNOSIS — Z1211 Encounter for screening for malignant neoplasm of colon: Secondary | ICD-10-CM | POA: Diagnosis present

## 2024-03-14 DIAGNOSIS — K644 Residual hemorrhoidal skin tags: Secondary | ICD-10-CM | POA: Insufficient documentation

## 2024-03-14 HISTORY — PX: COLONOSCOPY: SHX5424

## 2024-03-14 HISTORY — PX: POLYPECTOMY: SHX149

## 2024-03-14 LAB — POCT PREGNANCY, URINE: Preg Test, Ur: NEGATIVE

## 2024-03-14 SURGERY — COLONOSCOPY
Anesthesia: General | Site: Rectum

## 2024-03-14 MED ORDER — SODIUM CHLORIDE 0.9 % IV SOLN: INTRAVENOUS | Status: DC

## 2024-03-14 MED ORDER — LIDOCAINE HCL (CARDIAC) PF 100 MG/5ML IV SOSY
PREFILLED_SYRINGE | INTRAVENOUS | Status: DC | PRN
  Administered 2024-03-14: 50 mg via INTRAVENOUS

## 2024-03-14 MED ORDER — STERILE WATER FOR IRRIGATION IR SOLN
Status: DC | PRN
  Administered 2024-03-14: 1

## 2024-03-14 MED ORDER — PROPOFOL 10 MG/ML IV BOLUS
INTRAVENOUS | Status: AC
  Filled 2024-03-14: qty 20

## 2024-03-14 MED ORDER — LIDOCAINE HCL (PF) 2 % IJ SOLN
INTRAMUSCULAR | Status: AC
  Filled 2024-03-14: qty 5

## 2024-03-14 MED ORDER — PROPOFOL 10 MG/ML IV BOLUS
INTRAVENOUS | Status: DC | PRN
  Administered 2024-03-14 (×2): 90 mg via INTRAVENOUS
  Administered 2024-03-14: 50 mg via INTRAVENOUS
  Administered 2024-03-14 (×2): 90 mg via INTRAVENOUS
  Administered 2024-03-14: 50 mg via INTRAVENOUS
  Administered 2024-03-14: 90 mg via INTRAVENOUS

## 2024-03-14 MED ORDER — LACTATED RINGERS IV SOLN: INTRAVENOUS | Status: DC

## 2024-03-14 MED ORDER — PROPOFOL 10 MG/ML IV BOLUS
INTRAVENOUS | Status: AC
  Filled 2024-03-14: qty 40

## 2024-03-14 SURGICAL SUPPLY — 6 items
GOWN CVR UNV OPN BCK APRN NK (MISCELLANEOUS) ×2 IMPLANT
KIT PROCEDURE OLYMPUS (MISCELLANEOUS) ×1 IMPLANT
MANIFOLD NEPTUNE II (INSTRUMENTS) ×1 IMPLANT
SNARE COLD EXACTO (MISCELLANEOUS) IMPLANT
TRAP ETRAP POLY (MISCELLANEOUS) IMPLANT
WATER STERILE IRR 250ML POUR (IV SOLUTION) ×1 IMPLANT

## 2024-03-14 NOTE — Transfer of Care (Signed)
 Immediate Anesthesia Transfer of Care Note  Patient: Gabrielle Jarvis  Procedure(s) Performed: COLONOSCOPY WITH BIOPSY (Rectum) POLYPECTOMY, INTESTINE (Rectum)  Patient Location: PACU  Anesthesia Type: General  Level of Consciousness: awake, alert  and patient cooperative  Airway and Oxygen Therapy: Patient Spontanous Breathing and Patient connected to supplemental oxygen  Post-op Assessment: Post-op Vital signs reviewed, Patient's Cardiovascular Status Stable, Respiratory Function Stable, Patent Airway and No signs of Nausea or vomiting  Post-op Vital Signs: Reviewed and stable  Complications: No notable events documented.

## 2024-03-14 NOTE — Anesthesia Postprocedure Evaluation (Signed)
 Anesthesia Post Note  Patient: Gabrielle Jarvis  Procedure(s) Performed: COLONOSCOPY WITH BIOPSY (Rectum) POLYPECTOMY, INTESTINE (Rectum)  Patient location during evaluation: PACU Anesthesia Type: General Level of consciousness: awake and alert Pain management: pain level controlled Vital Signs Assessment: post-procedure vital signs reviewed and stable Respiratory status: spontaneous breathing, nonlabored ventilation, respiratory function stable and patient connected to nasal cannula oxygen Cardiovascular status: blood pressure returned to baseline and stable Postop Assessment: no apparent nausea or vomiting Anesthetic complications: no   No notable events documented.   Last Vitals:  Vitals:   03/14/24 0854 03/14/24 0900  BP: (!) 106/53 (!) 115/54  Pulse: 88 77  Resp: 14 (!) 22  Temp: (!) 36.3 C   SpO2: 99% 100%    Last Pain:  Vitals:   03/14/24 0900  TempSrc:   PainSc: 0-No pain                 Celesta Funderburk C Arias Weinert

## 2024-03-14 NOTE — H&P (Signed)
 Corinn JONELLE Brooklyn, MD [facility], [facility] [address]  [place], [place] [address]  Main: [phone] Fax:  [phone] Pager: [phone]   Primary Care Physician:  Don Lauraine Collar, NP Primary Gastroenterologist:  Dr. Corinn JONELLE Brooklyn  Pre-Procedure History & Physical: HPI:  Gabrielle Jarvis is a 43 y.o. female is here for an colonoscopy.   History reviewed. No pertinent past medical history.  History reviewed. No pertinent surgical history.  Prior to Admission medications   Medication Sig Start Date End Date Taking? Authorizing Provider  amoxicillin -clavulanate (AUGMENTIN ) 875-125 MG tablet Take 1 tablet by mouth every 12 (twelve) hours. Patient not taking: Reported on 03/09/2024 05/07/22   Vicky Charleston, PA-C  chlorpheniramine-HYDROcodone [facility] ER) 10-8 MG/5ML SUER Take 5 mLs by mouth at bedtime as needed for cough. do not drive or operate machinery while taking as can cause drowsiness. Patient not taking: Reported on 03/09/2024 07/06/17   Cleotilde Jacobsen, NP  oseltamivir  (TAMIFLU ) 75 MG capsule Take 1 capsule (75 mg total) by mouth every 12 (twelve) hours. Patient not taking: Reported on 03/09/2024 07/14/17   Cleotilde Jacobsen, NP    Allergies as of 02/04/2024   (No Known Allergies)    Family History  Problem Relation Age of Onset   Parkinson's disease Father    Breast cancer Paternal Aunt 47   Breast cancer Maternal Grandmother    Breast cancer Paternal Grandmother     Social History   Socioeconomic History   Marital status: Married    Spouse name: Not on file   Number of children: Not on file   Years of education: Not on file   Highest education level: Not on file  Occupational History   Not on file  Tobacco Use   Smoking status: Never   Smokeless tobacco: Never  Vaping Use   Vaping status: Never Used  Substance and Sexual Activity   Alcohol use: Yes    Comment: rarely   Drug use: Not on file   Sexual activity:  Not on file  Other Topics Concern   Not on file  Social History Narrative   Not on file   Social Drivers of Health   Financial Resource Strain: Low Risk  (12/20/2023)   Received from [facility]   Overall Financial Resource Strain (CARDIA)    Difficulty of Paying Living Expenses: Not hard at all  Food Insecurity: No Food Insecurity (12/20/2023)   Received from [facility]   Hunger Vital Sign    Within the past 12 months, you worried that your food would run out before you got the money to buy more.: Never true    Within the past 12 months, the food you bought just didn't last and you didn't have money to get more.: Never true  Transportation Needs: No Transportation Needs (12/20/2023)   Received from [facility] - Transportation    In the past 12 months, has lack of transportation kept you from medical appointments or from getting medications?: No    Lack of Transportation (Non-Medical): No  Physical Activity: Not on file  Stress: Not on file  Social Connections: Not on file  Intimate Partner Violence: Not on file    Review of Systems: See HPI, otherwise negative ROS  Physical Exam: BP (!) 110/58   Pulse 70   Temp 98.2 F (36.8 C) (Temporal)   Resp 16   Ht 5' 8 (1.727 m)   Wt 94.8  kg   LMP 03/03/2024 (Approximate)   SpO2 100%   BMI 31.78 kg/m  General:   Alert,  pleasant and cooperative in NAD Head:  Normocephalic and atraumatic. Neck:  Supple; no masses or thyromegaly. Lungs:  Clear throughout to auscultation.    Heart:  Regular rate and rhythm. Abdomen:  Soft, nontender and nondistended. Normal bowel sounds, without guarding, and without rebound.   Neurologic:  Alert and  oriented x4;  grossly normal neurologically.  Impression/Plan: Gabrielle Jarvis is here for an colonoscopy to be performed for colon cancer screening  Risks, benefits, limitations, and alternatives regarding  colonoscopy have been  reviewed with the patient.  Questions have been answered.  All parties agreeable.   Corinn Brooklyn, MD  03/14/2024, 8:24 AM

## 2024-03-14 NOTE — Op Note (Signed)
 [facility] Patient Name: Gabrielle Jarvis Procedure Date: 03/14/2024 8:24 AM MRN: [id number] Account #: [id number] Date of Birth: [birth year] Admit Type: Outpatient Age: 43 Room: [facility] OR ROOM 01 Gender: Female Note Status: Finalized Instrument Name: Arvis [id number] Procedure:             Colonoscopy Indications:           Screening for colorectal malignant neoplasm, This is                         the patient's first colonoscopy Providers:             Corinn Jess Brooklyn MD, MD Referring MD:          Lauraine LOIS Leak (Referring MD) Medicines:             General Anesthesia Complications:         No immediate complications. Estimated blood loss: None. Procedure:             Pre-Anesthesia Assessment:                        - Prior to the procedure, a History and Physical was                         performed, and patient medications and allergies were                         reviewed. The patient is competent. The risks and                         benefits of the procedure and the sedation options and                         risks were discussed with the patient. All questions                         were answered and informed consent was obtained.                         Patient identification and proposed procedure were                         verified by the physician, the nurse, the                         anesthesiologist, the anesthetist and the technician                         in the pre-procedure area in the procedure room in the                         endoscopy suite. Mental Status Examination: alert and                         oriented. Airway Examination: normal oropharyngeal                         airway and neck mobility. Respiratory Examination:  clear to auscultation. CV Examination: normal.                         Prophylactic Antibiotics: The patient does not require                         prophylactic  antibiotics. Prior Anticoagulants: The                         patient has taken no anticoagulant or antiplatelet                         agents. ASA Grade Assessment: I - A normal, healthy                         patient. After reviewing the risks and benefits, the                         patient was deemed in satisfactory condition to                         undergo the procedure. The anesthesia plan was to use                         general anesthesia. Immediately prior to                         administration of medications, the patient was                         re-assessed for adequacy to receive sedatives. The                         heart rate, respiratory rate, oxygen saturations,                         blood pressure, adequacy of pulmonary ventilation, and                         response to care were monitored throughout the                         procedure. The physical status of the patient was                         re-assessed after the procedure.                        After obtaining informed consent, the colonoscope was                         passed under direct vision. Throughout the procedure,                         the patient's blood pressure, pulse, and oxygen                         saturations were monitored continuously. The  Colonoscope was introduced through the anus and                         advanced to the the cecum, identified by appendiceal                         orifice and ileocecal valve. The colonoscopy was                         performed with difficulty due to significant looping                         and the patient's body habitus. Successful completion                         of the procedure was aided by applying abdominal                         pressure. The patient tolerated the procedure well.                         The quality of the bowel preparation was evaluated                         using the BBPS  [facility] Bowel Preparation Scale) with                         scores of: Right Colon = 3, Transverse Colon = 3 and                         Left Colon = 3 (entire mucosa seen well with no                         residual staining, small fragments of stool or opaque                         liquid). The total BBPS score equals 9. The ileocecal                         valve, appendiceal orifice, and rectum were                         photographed. Findings:      The perianal and digital rectal examinations were normal. Pertinent       negatives include normal sphincter tone and no palpable rectal lesions.      A 6 mm polyp was found in the sigmoid colon. The polyp was sessile. The       polyp was removed with a cold snare. Resection and retrieval were       complete. Estimated blood loss: none.      Non-bleeding external hemorrhoids were found during retroflexion. The       hemorrhoids were medium-sized.      The exam was otherwise without abnormality. Impression:            - One 6 mm polyp in the sigmoid colon, removed with a  cold snare. Resected and retrieved.                        - Non-bleeding external hemorrhoids.                        - The examination was otherwise normal. Recommendation:        - Discharge patient to home (with escort).                        - Resume previous diet today.                        - Continue present medications.                        - Await pathology results.                        - Repeat colonoscopy in 5 years for surveillance. Procedure Code(s):     --- Professional ---                        [phone], Colonoscopy, flexible; with removal of                         tumor(s), polyp(s), or other lesion(s) by snare                         technique Diagnosis Code(s):     --- Professional ---                        Z12.11, Encounter for screening for malignant neoplasm                         of colon                         D12.5, Benign neoplasm of sigmoid colon                        K64.4, Residual hemorrhoidal skin tags CPT copyright 2022 [employer]. All rights reserved. The codes documented in this report are preliminary and upon coder review may  be revised to meet current compliance requirements. Dr. Corinn Brooklyn Corinn Jess Brooklyn MD, MD 03/14/2024 8:53:02 AM This report has been signed electronically. Number of Addenda: 0 Note Initiated On: 03/14/2024 8:24 AM Scope Withdrawal Time: 0 hours 8 minutes 55 seconds  Total Procedure Duration: 0 hours 15 minutes 23 seconds  Estimated Blood Loss:  Estimated blood loss was minimal.      [facility]

## 2024-03-16 LAB — SURGICAL PATHOLOGY

## 2024-03-17 ENCOUNTER — Ambulatory Visit: Payer: Self-pay | Admitting: Gastroenterology
